# Patient Record
Sex: Female | Born: 1990 | Hispanic: Yes | Marital: Single | Smoking: Never smoker
Health system: Southern US, Community
[De-identification: ages and names within clinical notes are randomized; demographics above are authoritative.]

---
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
## 2012-01-08 ENCOUNTER — Ambulatory Visit: Payer: Self-pay

## 2012-08-24 ENCOUNTER — Emergency Department: Payer: Self-pay | Admitting: Emergency Medicine

## 2012-08-24 LAB — COMPREHENSIVE METABOLIC PANEL
Albumin: 4.1 g/dL (ref 3.4–5.0)
Anion Gap: 8 (ref 7–16)
BUN: 7 mg/dL (ref 7–18)
Calcium, Total: 9.3 mg/dL (ref 8.5–10.1)
Chloride: 100 mmol/L (ref 98–107)
EGFR (Non-African Amer.): >60
Glucose: 89 mg/dL (ref 65–99)
Osmolality: 269 (ref 275–301)
Potassium: 3.3 mmol/L — ABNORMAL LOW (ref 3.5–5.1)
SGOT(AST): 23 U/L (ref 15–37)
Sodium: 136 mmol/L (ref 136–145)
Total Protein: 8.4 g/dL — ABNORMAL HIGH (ref 6.4–8.2)

## 2012-08-24 LAB — CBC
HCT: 40.2 % (ref 35.0–47.0)
MCH: 31 pg (ref 26.0–34.0)
MCHC: 34.3 g/dL (ref 32.0–36.0)
MCV: 90 fL (ref 80–100)
RBC: 4.45 10*6/uL (ref 3.80–5.20)
WBC: 5.8 10*3/uL (ref 3.6–11.0)

## 2012-08-24 LAB — PREGNANCY, URINE: Pregnancy Test, Urine: NEGATIVE m[IU]/mL

## 2012-08-24 LAB — URINALYSIS, COMPLETE
Bacteria: NONE SEEN
Leukocyte Esterase: NEGATIVE
Nitrite: NEGATIVE
Ph: 6 (ref 4.5–8.0)
Protein: 30
RBC,UR: 1 /HPF (ref 0–5)
Specific Gravity: 1.027 (ref 1.003–1.030)
Squamous Epithelial: 2

## 2016-10-12 ENCOUNTER — Other Ambulatory Visit: Payer: Self-pay | Admitting: Physician Assistant

## 2016-10-12 DIAGNOSIS — Z3201 Encounter for pregnancy test, result positive: Secondary | ICD-10-CM

## 2016-10-21 ENCOUNTER — Ambulatory Visit
Admission: RE | Admit: 2016-10-21 | Discharge: 2016-10-21 | Disposition: A | Discharge status: Home / Self Care | Payer: Self-pay | Source: Ambulatory Visit | Attending: Physician Assistant | Admitting: Physician Assistant

## 2016-10-21 DIAGNOSIS — O4691 Antepartum hemorrhage, unspecified, first trimester: Secondary | ICD-10-CM | POA: Insufficient documentation

## 2016-10-21 DIAGNOSIS — Z3A01 Less than 8 weeks gestation of pregnancy: Secondary | ICD-10-CM | POA: Insufficient documentation

## 2016-10-21 DIAGNOSIS — Z3201 Encounter for pregnancy test, result positive: Secondary | ICD-10-CM | POA: Insufficient documentation

## 2016-12-22 ENCOUNTER — Emergency Department
Admission: EM | Admit: 2016-12-22 | Discharge: 2016-12-22 | Disposition: A | Discharge status: Home / Self Care | Payer: Self-pay | Attending: Emergency Medicine | Admitting: Emergency Medicine

## 2016-12-22 DIAGNOSIS — O219 Vomiting of pregnancy, unspecified: Secondary | ICD-10-CM | POA: Insufficient documentation

## 2016-12-22 DIAGNOSIS — Z3A16 16 weeks gestation of pregnancy: Secondary | ICD-10-CM | POA: Insufficient documentation

## 2016-12-22 DIAGNOSIS — R112 Nausea with vomiting, unspecified: Secondary | ICD-10-CM

## 2016-12-22 DIAGNOSIS — R109 Unspecified abdominal pain: Secondary | ICD-10-CM | POA: Insufficient documentation

## 2016-12-22 DIAGNOSIS — O26892 Other specified pregnancy related conditions, second trimester: Secondary | ICD-10-CM | POA: Insufficient documentation

## 2016-12-22 LAB — CBC WITH DIFFERENTIAL/PLATELET
Basophils Absolute: 0 10*3/uL (ref 0–0.1)
Basophils Relative: 0 %
EOS ABS: 0 10*3/uL (ref 0–0.7)
Eosinophils Relative: 0 %
HEMATOCRIT: 36.2 % (ref 35.0–47.0)
HEMOGLOBIN: 12.7 g/dL (ref 12.0–16.0)
LYMPHS PCT: 9 %
Lymphs Abs: 0.7 10*3/uL — ABNORMAL LOW (ref 1.0–3.6)
MCH: 31.4 pg (ref 26.0–34.0)
MCHC: 35.1 g/dL (ref 32.0–36.0)
MCV: 89.6 fL (ref 80.0–100.0)
MONOS PCT: 8 %
Monocytes Absolute: 0.6 10*3/uL (ref 0.2–0.9)
NEUTROS ABS: 6.8 10*3/uL — AB (ref 1.4–6.5)
NEUTROS PCT: 83 %
Platelets: 236 10*3/uL (ref 150–440)
RBC: 4.03 MIL/uL (ref 3.80–5.20)
RDW: 13.5 % (ref 11.5–14.5)
WBC: 8.2 10*3/uL (ref 3.6–11.0)

## 2016-12-22 LAB — COMPREHENSIVE METABOLIC PANEL
ALBUMIN: 3.6 g/dL (ref 3.5–5.0)
ALK PHOS: 47 U/L (ref 38–126)
ALT: 17 U/L (ref 14–54)
AST: 25 U/L (ref 15–41)
Anion gap: 6 (ref 5–15)
BILIRUBIN TOTAL: 0.6 mg/dL (ref 0.3–1.2)
BUN: 7 mg/dL (ref 6–20)
CO2: 26 mmol/L (ref 22–32)
CREATININE: 0.42 mg/dL — AB (ref 0.44–1.00)
Calcium: 8.7 mg/dL — ABNORMAL LOW (ref 8.9–10.3)
Chloride: 99 mmol/L — ABNORMAL LOW (ref 101–111)
GFR calc Af Amer: >60 mL/min (ref >60)
GFR calc non Af Amer: >60 mL/min (ref >60)
GLUCOSE: 93 mg/dL (ref 65–99)
Potassium: 3.5 mmol/L (ref 3.5–5.1)
SODIUM: 131 mmol/L — AB (ref 135–145)
TOTAL PROTEIN: 7 g/dL (ref 6.5–8.1)

## 2016-12-22 LAB — URINALYSIS, COMPLETE (UACMP) WITH MICROSCOPIC
BACTERIA UA: NONE SEEN
Bilirubin Urine: NEGATIVE
Glucose, UA: NEGATIVE mg/dL
Hgb urine dipstick: NEGATIVE
KETONES UR: 5 mg/dL — AB
LEUKOCYTES UA: NEGATIVE
Nitrite: NEGATIVE
PROTEIN: NEGATIVE mg/dL
Specific Gravity, Urine: 1.01 (ref 1.005–1.030)
pH: 7 (ref 5.0–8.0)

## 2016-12-22 LAB — BETA-HYDROXYBUTYRIC ACID: Beta-Hydroxybutyric Acid: 0.08 mmol/L (ref 0.05–0.27)

## 2016-12-22 MED ORDER — ONDANSETRON 4 MG PO TBDP: 4.0000 mg | ORAL_TABLET | Freq: Three times a day (TID) | ORAL | 0 refills | Status: AC | PRN

## 2016-12-22 MED ORDER — METOCLOPRAMIDE HCL 5 MG/ML IJ SOLN
10.0000 mg | Freq: Once | INTRAMUSCULAR | Status: AC
Start: 2016-12-22 — End: 2016-12-22
  Administered 2016-12-22: 10 mg via INTRAVENOUS
  Filled 2016-12-22: qty 2

## 2016-12-22 MED ORDER — SODIUM CHLORIDE 0.9 % IV BOLUS (SEPSIS)
1000.0000 mL | Freq: Once | INTRAVENOUS | Status: AC
  Administered 2016-12-22: 1000 mL via INTRAVENOUS

## 2016-12-22 MED ORDER — DEXTROSE-NACL 5-0.9 % IV SOLN
INTRAVENOUS | Status: DC
  Administered 2016-12-22: 17:00:00 via INTRAVENOUS

## 2016-12-22 NOTE — Discharge Instructions (Signed)
[facility] for rec[facility]. Return to the emergency department for any concerns.  It was a pleasure to take care of you today, and thank you for coming to our emergency department.  If you have any questions or concerns before leaving please ask the nurse to grab me and I'm more than happy to go through your aftercare instructions again.  If you were prescribed any opioid pain medication today such as Norco, Vicodin, Percocet, morphine, hydrocodone, or oxycodone please make sure you do not drive when you are taking this medication as it can alter your ability to drive safely.  If you have any concerns once you are home that you are not improving or are in fact getting worse before you can make it to your follow-up appointment, please do not hesitate to call 911 and come back for further evaluation.  Merrily BrittleNeil Milarose Savich MD  Results for orders placed or performed during the hospital encounter of 12/22/16  CBC with Differential  Result Value Ref Range   WBC 8.2 3.6 - 11.0 K/uL   RBC 4.03 3.80 - 5.20 MIL/uL   Hemoglobin 12.7 12.0 - 16.0 g/dL   HCT 62.936.2 52.835.0 - 41.347.0 %   MCV 89.6 80.0 - 100.0 fL   MCH 31.4 26.0 - 34.0 pg   MCHC 35.1 32.0 - 36.0 g/dL   RDW 24.413.5 01.011.5 - 27.214.5 %   Platelets 236 150 - 440 K/uL   Neutrophils Relative % 83 %   Neutro Abs 6.8 (H) 1.4 - 6.5 K/uL   Lymphocytes Relative 9 %   Lymphs Abs 0.7 (L) 1.0 - 3.6 K/uL   Monocytes Relative 8 %   Monocytes Absolute 0.6 0.2 - 0.9 K/uL   Eosinophils Relative 0 %   Eosinophils Absolute 0.0 0 - 0.7 K/uL   Basophils Relative 0 %   Basophils Absolute 0.0 0 - 0.1 K/uL  Comprehensive metabolic panel  Result Value Ref Range   Sodium 131 (L) 135 - 145 mmol/L   Potassium 3.5 3.5 - 5.1 mmol/L   Chloride 99 (L) 101 - 111 mmol/L   CO2 26 22 - 32 mmol/L   Glucose, Bld 93 65 - 99 mg/dL   BUN 7 6 - 20 mg/dL   Creatinine, Ser 5.360.42 (L) 0.44 - 1.00 mg/dL   Calcium 8.7 (L) 8.9 - 10.3 mg/dL   Total  Protein 7.0 6.5 - 8.1 g/dL   Albumin 3.6 3.5 - 5.0 g/dL   AST 25 15 - 41 U/L   ALT 17 14 - 54 U/L   Alkaline Phosphatase 47 38 - 126 U/L   Total Bilirubin 0.6 0.3 - 1.2 mg/dL   GFR calc non Af Amer >60 >60 mL/min   GFR calc Af Amer >60 >60 mL/min   Anion gap 6 5 - 15  Urinalysis, Complete w Microscopic  Result Value Ref Range   Color, Urine YELLOW (A) YELLOW   APPearance CLEAR (A) CLEAR   Specific Gravity, Urine 1.010 1.005 - 1.030   pH 7.0 5.0 - 8.0   Glucose, UA NEGATIVE NEGATIVE mg/dL   Hgb urine dipstick NEGATIVE NEGATIVE   Bilirubin Urine NEGATIVE NEGATIVE   Ketones, ur 5 (A) NEGATIVE mg/dL   Protein, ur NEGATIVE NEGATIVE mg/dL   Nitrite NEGATIVE NEGATIVE   Leukocytes, UA NEGATIVE NEGATIVE   RBC / HPF 0-5 0 - 5 RBC/hpf   WBC, UA 0-5 0 - 5 WBC/hpf   Bacteria, UA NONE SEEN NONE SEEN  Squamous Epithelial / LPF 0-5 (A) NONE SEEN

## 2016-12-22 NOTE — ED Notes (Signed)
Pt states she is [redacted] weeks pregnant, states decreased PO intake and vomiting for 4 days, awake and alert in no acute distress

## 2016-12-22 NOTE — ED Triage Notes (Signed)
Pt reports that she started vomiting Friday and since then she has not been able to hold anything down - she reports that she is vomiting food and water - reports headache - denies any other symptoms - pt is [redacted] weeks pregnant

## 2016-12-22 NOTE — ED Provider Notes (Signed)
[facility] Emergency Department Provider Note  ____________________________________________   First MD Initiated Contact with Patient 12/22/16 1630     (approximate)  I have reviewed the triage vital signs and the nursing notes.   HISTORY  Chief Complaint Emesis During Pregnancy    HPI Paula Johnston is a 26 y.o. female G3 P1 with one miscarriage who is roughly [redacted] weeks pregnant now and comes to the emergency department with 10 weeks of nausea and vomiting and difficulty keeping food down. She is followed by her OB gynecologist and has tried 3 or 4 unknown medications. She said her OB does not feel comfortable prescribing her Zofran and she said that Zofran worked for her in her previous pregnancy and she would like a prescription. She came to the ER today because she has had difficulty eating food for the past 4 days or so. Pain but has significant discomfort associated with her nausea which is worse after eating and improved when she is not trying to eat.   History reviewed. No pertinent past medical history.  There are no active problems to display for this patient.   History reviewed. No pertinent surgical history.  Prior to Admission medications   Medication Sig Start Date End Date Taking? Authorizing Provider  ondansetron (ZOFRAN ODT) 4 MG disintegrating tablet Take 1 tablet (4 mg total) by mouth every 8 (eight) hours as needed for nausea or vomiting. 12/22/16   Merrily Brittle, MD    Allergies Patient has no known allergies.  No family history on file.  Social History Social History  Substance Use Topics  . Smoking status: Never Smoker  . Smokeless tobacco: Never Used  . Alcohol use No    Review of Systems Constitutional: No fever/chills Eyes: No visual changes. ENT: No sore throat. Cardiovascular: Denies chest pain. Respiratory: Denies shortness of breath. Gastrointestinal: Positive abdominal pain.  Positive nausea,  positive vomiting.  No diarrhea.  No constipation. Genitourinary: Negative for dysuria. Musculoskeletal: Negative for back pain. Skin: Negative for rash. Neurological: Negative for headaches, focal weakness or numbness.  10-point ROS otherwise negative.  ____________________________________________   PHYSICAL EXAM:  VITAL SIGNS: ED Triage Vitals  Enc Vitals Group     BP 12/22/16 1528 112/65     Pulse Rate 12/22/16 1528 83     Resp 12/22/16 1528 16     Temp 12/22/16 1528 98.5 F (36.9 C)     Temp Source 12/22/16 1528 Oral     SpO2 12/22/16 1528 99 %     Weight 12/22/16 1528 130 lb (59 kg)     Height 12/22/16 1528 5\' 2"  (1.575 m)     Head Circumference --      Peak Flow --      Pain Score 12/22/16 1527 4     Pain Loc --      Pain Edu? --      Excl. in GC? --     Constitutional: Alert and oriented x 4 well appearing nontoxic no diaphoresis speaks in full, clear sentences Eyes: PERRL EOMI. Head: Atraumatic. Nose: No congestion/rhinnorhea. Mouth/Throat: No trismus Neck: No stridor.   Cardiovascular: Normal rate, regular rhythm. Grossly normal heart sounds.  Good peripheral circulation. Respiratory: Normal respiratory effort.  No retractions. Lungs CTAB and moving good air Gastrointestinal: Soft nondistended nontender no rebound or guarding no peritonitis Musculoskeletal: No lower extremity edema   Neurologic:  Normal speech and language. No gross focal neurologic deficits are appreciated. Skin:  Skin is warm, dry  and intact. No rash noted. Psychiatric: Mood and affect are normal. Speech and behavior are normal.    ____________________________________________   _______________________________________   LABS (all labs ordered are listed, but only abnormal results are displayed)  Labs Reviewed  CBC WITH DIFFERENTIAL/PLATELET - Abnormal; Notable for the following:       Result Value   Neutro Abs 6.8 (*)    Lymphs Abs 0.7 (*)    All other components within normal  limits  COMPREHENSIVE METABOLIC PANEL - Abnormal; Notable for the following:    Sodium 131 (*)    Chloride 99 (*)    Creatinine, Ser 0.42 (*)    Calcium 8.7 (*)    All other components within normal limits  URINALYSIS, COMPLETE (UACMP) WITH MICROSCOPIC - Abnormal; Notable for the following:    Color, Urine YELLOW (*)    APPearance CLEAR (*)    Ketones, ur 5 (*)    Squamous Epithelial / LPF 0-5 (*)    All other components within normal limits  BETA-HYDROXYBUTYRIC ACID    Trace ketones no evidence of hyperemesis gravidarum __________________________________________  EKG   ____________________________________________  RADIOLOGY   ____________________________________________   PROCEDURES  Procedure(s) performed: no  Procedures  Critical Care performed: no  Observation: no ____________________________________________   INITIAL IMPRESSION / ASSESSMENT AND PLAN / ED COURSE  Pertinent labs & imaging results that were available during my care of the patient were reviewed by me and considered in my medical decision making (see chart for details).  On arrival the patient appears somewhat comfortable not actively retching or vomiting. Given her reported significant emesis labs are pending. I will give her 10 mg of Reglan now as well as D5NS.      _____----------------------------------------- 6:02 PM on 12/22/2016 -----------------------------------------  The patient's nausea is improved and she is able to eat and drink. I will give her a short course of 10 tablets of Zofran and [facility]. _______________________________________   FINAL CLINICAL IMPRESSION(S) / ED DIAGNOSES  Final diagnoses:  Non-intractable vomiting with nausea, unspecified vomiting type      NEW MEDICATIONS STARTED DURING THIS VISIT:  Discharge Medication List as of 12/22/2016  6:00 PM    START taking these medications   Details  ondansetron (ZOFRAN  ODT) 4 MG disintegrating tablet Take 1 tablet (4 mg total) by mouth every 8 (eight) hours as needed for nausea or vomiting., Starting Tue 12/22/2016, Print         Note:  This document was prepared using Dragon voice recognition software and may include unintentional dictation errors.     Merrily Brittleifenbark, Taiylor Virden, MD 12/26/16 [id number]

## 2017-07-30 ENCOUNTER — Encounter: Payer: Self-pay | Admitting: *Deleted

## 2017-07-30 ENCOUNTER — Encounter (INDEPENDENT_AMBULATORY_CARE_PROVIDER_SITE_OTHER): Payer: Self-pay

## 2017-07-30 ENCOUNTER — Ambulatory Visit: Payer: Self-pay | Attending: Oncology | Admitting: *Deleted

## 2017-07-30 VITALS — BP 112/67 | HR 69 | Temp 98.2°F | Ht 63.0 in | Wt 135.0 lb

## 2017-07-30 DIAGNOSIS — N63 Unspecified lump in unspecified breast: Secondary | ICD-10-CM

## 2017-07-30 NOTE — Progress Notes (Signed)
Subjective:     Patient ID: Paula Johnston, female   DOB: 1990/12/07, [age] y.o.   MRN: [id number]  HPI   Review of Systems     Objective:   Physical Exam  Pulmonary/Chest: Right breast exhibits nipple discharge. Right breast exhibits no inverted nipple, no mass, no skin change and no tenderness. Left breast exhibits mass. Left breast exhibits no inverted nipple, no nipple discharge, no skin change and no tenderness. Breasts are symmetrical.    Milky discharge from the right nipple on exam       Assessment:     26 year old English speaking Hispanic female [facility] Dr[facility] Sandrea HughsJessica Rubio for further evaluation of a left breast mass.  Patient is gravida 2, para 2.  Post partum last delivery on 05/29/17.  Patient states she found the mass when she was 7 months pregnant.  States it is unchanged.  Patient states she breast fed for one month.  On clinical breast exam there is milky white discharge from the right nipple on exam.  Patient states she is still experiencing some milky discharge.  I can palpate a very firm, mobile, non-tender approximate 2 cm mass at 2:30-3:00 left breast.  It is visual through the skin when manipulated.  Taught self breast awareness.  Patient has been screened for eligibility.  She does not have any insurance, Medicare or Medicaid.  She also meets financial eligibility.  Hand-out given on the Affordable Care Act.    Plan:     Will get ultrasound of the left breast.  If no findings on imaging will refer for surgical consult.  Will follow-up per [facility] protocol.

## 2017-07-30 NOTE — Patient Instructions (Signed)
Gave patient hand-out, Women Staying Healthy, Active and Well from [facility], with education on breast health, pap smears, heart and colon health. 

## 2017-08-12 ENCOUNTER — Ambulatory Visit
Admission: RE | Admit: 2017-08-12 | Discharge: 2017-08-12 | Disposition: A | Discharge status: Home / Self Care | Payer: Self-pay | Source: Ambulatory Visit | Attending: Oncology | Admitting: Oncology

## 2017-08-12 DIAGNOSIS — N63 Unspecified lump in unspecified breast: Secondary | ICD-10-CM

## 2017-08-13 ENCOUNTER — Telehealth: Payer: Self-pay | Admitting: *Deleted

## 2017-08-13 NOTE — Telephone Encounter (Signed)
Left message for patient to return my call.  She had her ultrasound with birads 3 results.  Per radiology comments patient prefers close follow-up rather than biopsy at this.  I would like to confirm her decision and schedule next appointment.

## 2017-09-16 ENCOUNTER — Encounter: Payer: Self-pay | Admitting: *Deleted

## 2017-09-16 ENCOUNTER — Other Ambulatory Visit: Payer: Self-pay | Admitting: *Deleted

## 2017-09-16 DIAGNOSIS — N63 Unspecified lump in unspecified breast: Secondary | ICD-10-CM

## 2017-09-16 NOTE — Progress Notes (Signed)
I have tried to call the patient again to confirm her desire to wait and have a six month follow-up ultrasound of her left breast.  She has not returned my call from December and their was no voicemail available today.  Mailed patient a letter with an appointment for her next ultrasound on Jan 11, 2018 @ 9:20.  HSIS to Sterretthristy.

## 2018-01-11 ENCOUNTER — Other Ambulatory Visit: Payer: Self-pay

## 2019-11-27 IMAGING — US US BREAST*L* LIMITED INC AXILLA
1 series · 10 of 10 positions shown · non-contrast
Comparison: None.

CLINICAL DATA: 26-year-old female with palpable left breast mass,
unchanged for 4 months.

EXAM:
ULTRASOUND OF THE LEFT BREAST

[Series 1: us breast*left* limited inc axilla · 0.06mm/px · 10 of 10 slices shown]
[im 1/10]
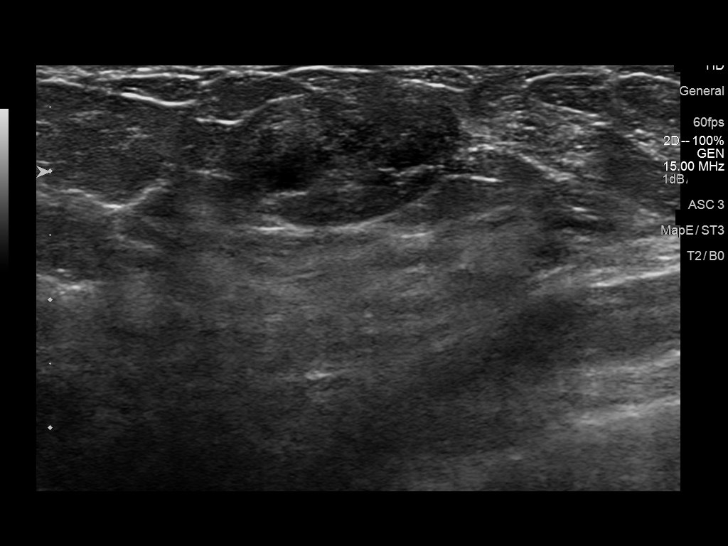
[im 2/10]
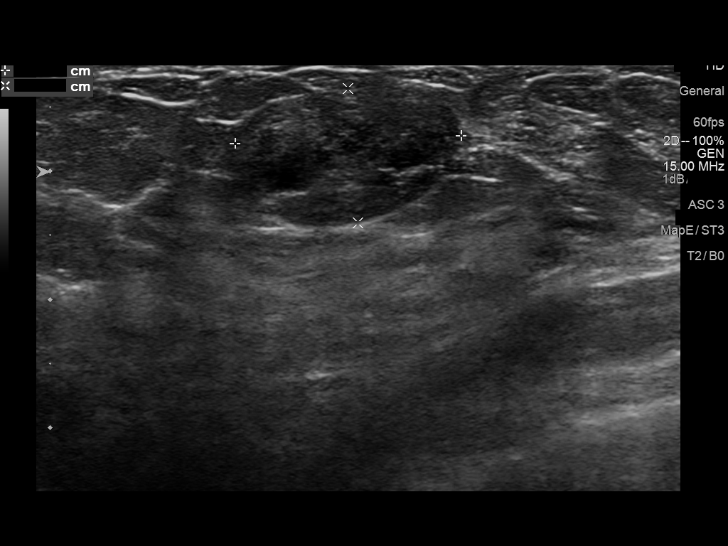
[im 3/10]
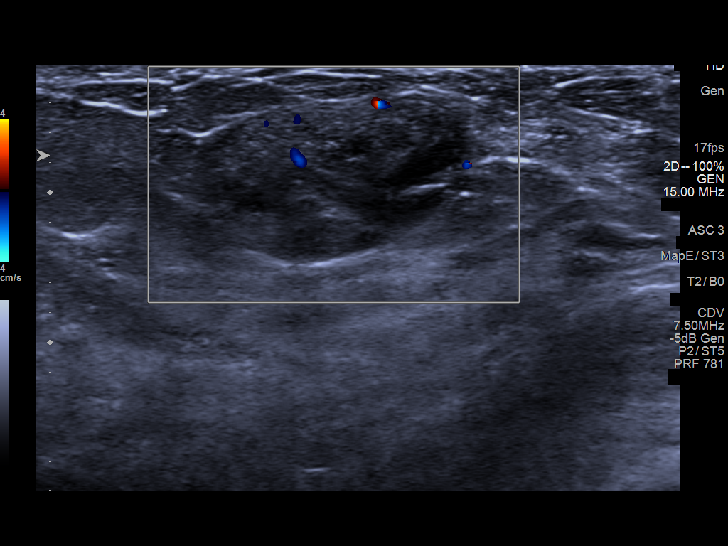
[im 4/10]
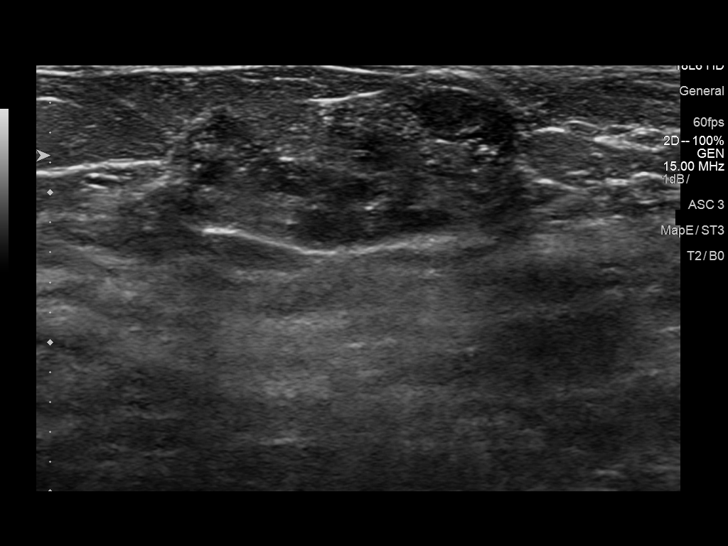
[im 5/10]
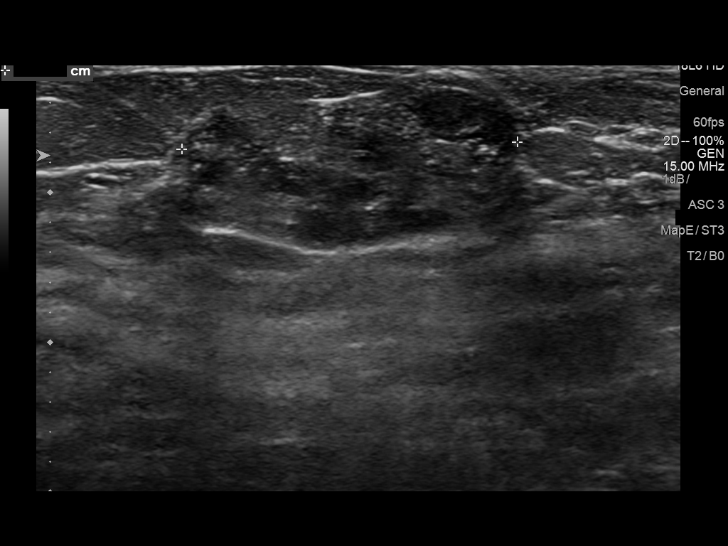
[im 6/10]
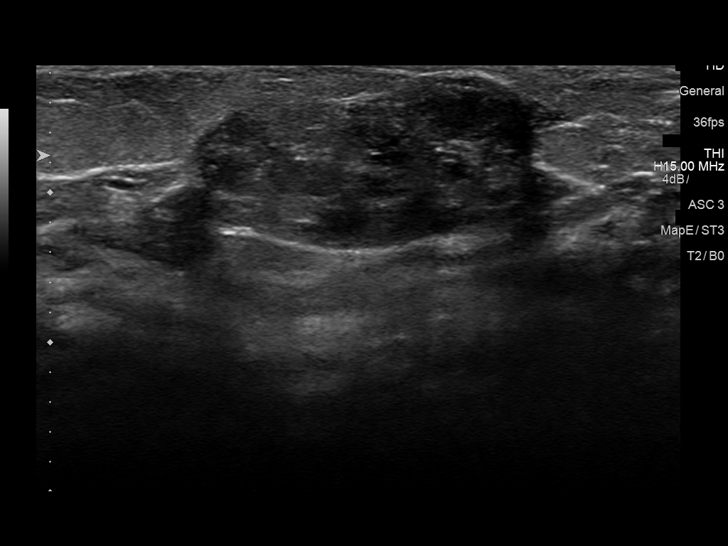
[im 7/10]
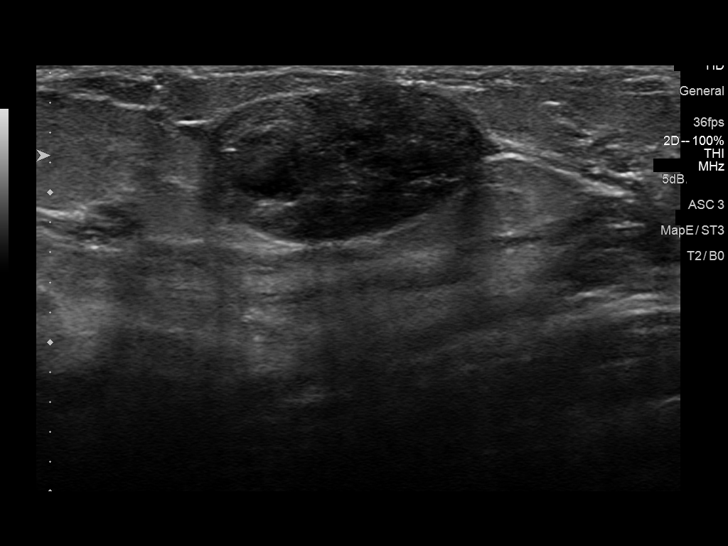
[im 8/10]
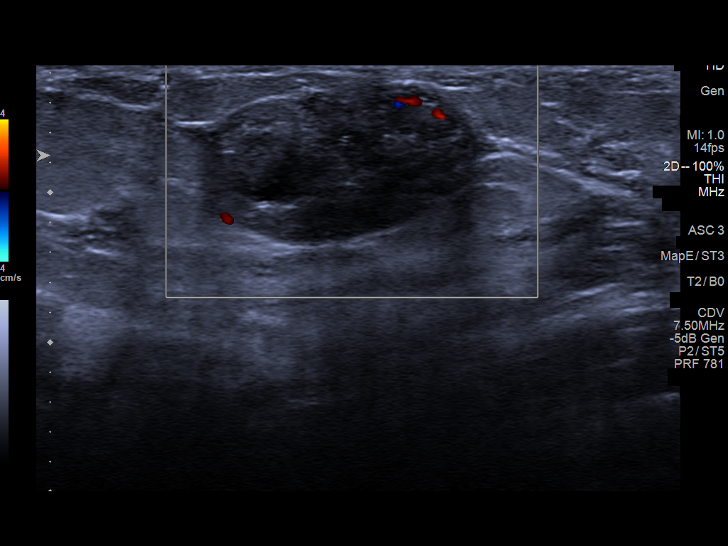
[im 9/10]
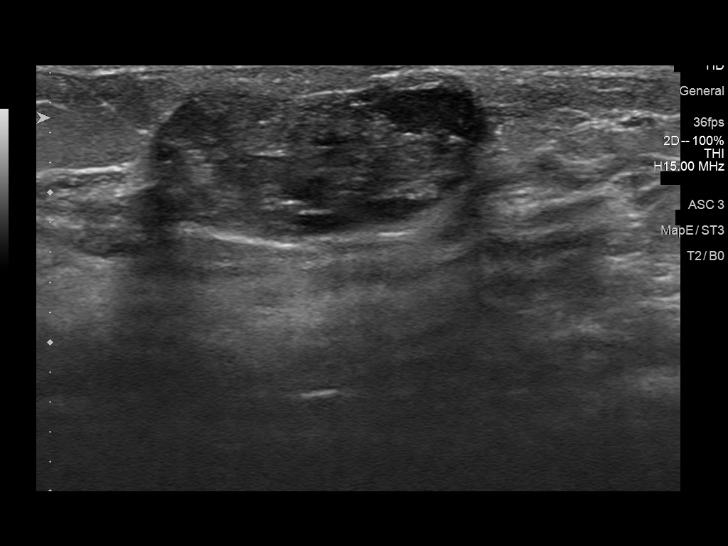
[im 10/10]
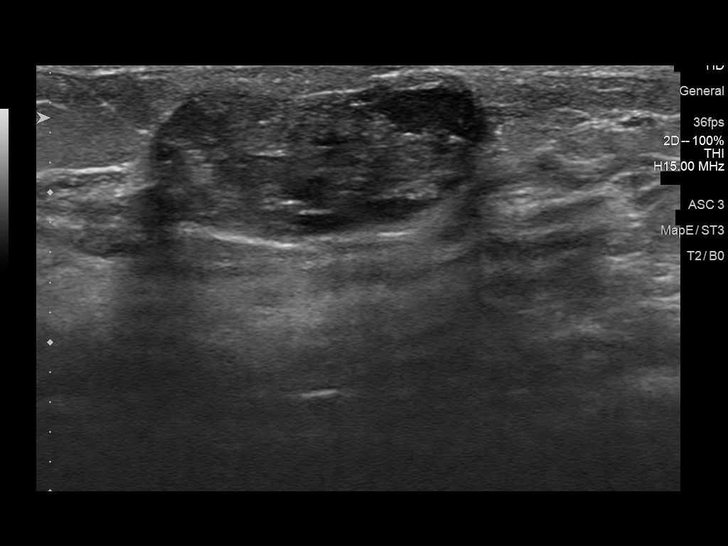

[10 of 10 positions shown; findings below may reference images not displayed]

FINDINGS: On physical exam, a firm mobile mass identified at the 2 o'clock
position of the left breast 4 cm from the nipple.

Targeted ultrasound is performed, showing a 1.8 x 1.1 x 2.2 cm
circumscribed oval slightly heterogeneous and hypoechoic parallel
mass at the 2 o'clock position of the left breast 4 cm from the
nipple. No suspicious right axillary lymph nodes are identified.
IMPRESSION: 2.2 cm likely benign mass/fibroadenoma within the upper-outer left
breast. We discussed management options including excision,
ultrasound-guided core biopsy, and close follow-up. Follow-up
ultrasound is recommended at 6, 12,and 24 months to assess
stability. The patient concurs with this plan.

RECOMMENDATION:
Left breast ultrasound in 6 months.

I have discussed the findings and recommendations with the patient.
She was instructed to contact her primary physician or our office if
this mass increased in size by double or more. Results were also
provided in writing at the conclusion of the visit. If applicable, a
reminder letter will be sent to the patient regarding the next
appointment.

BI-RADS CATEGORY  3: Probably benign.
